# Patient Record
Sex: Female | Born: 1985
Health system: Southern US, Community
[De-identification: ages and names within clinical notes are randomized; demographics above are authoritative.]

## PROBLEM LIST (undated history)

## (undated) DIAGNOSIS — F419 Anxiety disorder, unspecified: Secondary | ICD-10-CM

## (undated) DIAGNOSIS — K219 Gastro-esophageal reflux disease without esophagitis: Secondary | ICD-10-CM

## (undated) HISTORY — DX: Gastro-esophageal reflux disease without esophagitis: K21.9

## (undated) HISTORY — DX: Anxiety disorder, unspecified: F41.9

---

## 2019-02-27 DIAGNOSIS — G43109 Migraine with aura, not intractable, without status migrainosus: Secondary | ICD-10-CM | POA: Diagnosis not present

## 2019-05-16 DIAGNOSIS — M9901 Segmental and somatic dysfunction of cervical region: Secondary | ICD-10-CM | POA: Diagnosis not present

## 2019-05-16 DIAGNOSIS — M7918 Myalgia, other site: Secondary | ICD-10-CM | POA: Diagnosis not present

## 2019-05-16 DIAGNOSIS — G44209 Tension-type headache, unspecified, not intractable: Secondary | ICD-10-CM | POA: Diagnosis not present

## 2019-05-16 DIAGNOSIS — M9902 Segmental and somatic dysfunction of thoracic region: Secondary | ICD-10-CM | POA: Diagnosis not present

## 2019-05-22 DIAGNOSIS — M9901 Segmental and somatic dysfunction of cervical region: Secondary | ICD-10-CM | POA: Diagnosis not present

## 2019-05-22 DIAGNOSIS — M7918 Myalgia, other site: Secondary | ICD-10-CM | POA: Diagnosis not present

## 2019-05-22 DIAGNOSIS — G44209 Tension-type headache, unspecified, not intractable: Secondary | ICD-10-CM | POA: Diagnosis not present

## 2019-05-22 DIAGNOSIS — M9902 Segmental and somatic dysfunction of thoracic region: Secondary | ICD-10-CM | POA: Diagnosis not present

## 2019-05-24 DIAGNOSIS — M7918 Myalgia, other site: Secondary | ICD-10-CM | POA: Diagnosis not present

## 2019-05-24 DIAGNOSIS — M9902 Segmental and somatic dysfunction of thoracic region: Secondary | ICD-10-CM | POA: Diagnosis not present

## 2019-05-24 DIAGNOSIS — M9901 Segmental and somatic dysfunction of cervical region: Secondary | ICD-10-CM | POA: Diagnosis not present

## 2019-05-24 DIAGNOSIS — G44209 Tension-type headache, unspecified, not intractable: Secondary | ICD-10-CM | POA: Diagnosis not present

## 2019-05-29 DIAGNOSIS — M9901 Segmental and somatic dysfunction of cervical region: Secondary | ICD-10-CM | POA: Diagnosis not present

## 2019-05-29 DIAGNOSIS — M7918 Myalgia, other site: Secondary | ICD-10-CM | POA: Diagnosis not present

## 2019-05-29 DIAGNOSIS — G44209 Tension-type headache, unspecified, not intractable: Secondary | ICD-10-CM | POA: Diagnosis not present

## 2019-05-29 DIAGNOSIS — M9902 Segmental and somatic dysfunction of thoracic region: Secondary | ICD-10-CM | POA: Diagnosis not present

## 2019-06-05 DIAGNOSIS — F902 Attention-deficit hyperactivity disorder, combined type: Secondary | ICD-10-CM | POA: Diagnosis not present

## 2019-07-04 DIAGNOSIS — F901 Attention-deficit hyperactivity disorder, predominantly hyperactive type: Secondary | ICD-10-CM | POA: Diagnosis not present

## 2019-07-10 DIAGNOSIS — L814 Other melanin hyperpigmentation: Secondary | ICD-10-CM | POA: Diagnosis not present

## 2019-07-10 DIAGNOSIS — L858 Other specified epidermal thickening: Secondary | ICD-10-CM | POA: Diagnosis not present

## 2019-07-10 DIAGNOSIS — L245 Irritant contact dermatitis due to other chemical products: Secondary | ICD-10-CM | POA: Diagnosis not present

## 2019-07-26 DIAGNOSIS — Z20822 Contact with and (suspected) exposure to covid-19: Secondary | ICD-10-CM | POA: Diagnosis not present

## 2019-10-10 DIAGNOSIS — B348 Other viral infections of unspecified site: Secondary | ICD-10-CM | POA: Diagnosis not present

## 2019-10-10 DIAGNOSIS — D8989 Other specified disorders involving the immune mechanism, not elsewhere classified: Secondary | ICD-10-CM | POA: Diagnosis not present

## 2019-10-10 DIAGNOSIS — R05 Cough: Secondary | ICD-10-CM | POA: Diagnosis not present

## 2019-11-02 DIAGNOSIS — F330 Major depressive disorder, recurrent, mild: Secondary | ICD-10-CM | POA: Diagnosis not present

## 2019-11-05 ENCOUNTER — Other Ambulatory Visit: Payer: Self-pay | Admitting: Obstetrics and Gynecology

## 2019-11-05 DIAGNOSIS — Z0000 Encounter for general adult medical examination without abnormal findings: Secondary | ICD-10-CM | POA: Diagnosis not present

## 2019-11-05 DIAGNOSIS — N644 Mastodynia: Secondary | ICD-10-CM

## 2019-11-05 DIAGNOSIS — R102 Pelvic and perineal pain: Secondary | ICD-10-CM | POA: Diagnosis not present

## 2019-11-19 ENCOUNTER — Ambulatory Visit
Admission: RE | Admit: 2019-11-19 | Discharge: 2019-11-19 | Disposition: A | Discharge status: Home / Self Care | Payer: Self-pay | Source: Ambulatory Visit | Attending: Obstetrics and Gynecology | Admitting: Obstetrics and Gynecology

## 2019-11-19 ENCOUNTER — Ambulatory Visit
Admission: RE | Admit: 2019-11-19 | Discharge: 2019-11-19 | Disposition: A | Discharge status: Home / Self Care | Payer: BC Managed Care – PPO | Source: Ambulatory Visit | Attending: Obstetrics and Gynecology | Admitting: Obstetrics and Gynecology

## 2019-11-19 ENCOUNTER — Other Ambulatory Visit: Payer: Self-pay

## 2019-11-19 DIAGNOSIS — R928 Other abnormal and inconclusive findings on diagnostic imaging of breast: Secondary | ICD-10-CM | POA: Diagnosis not present

## 2019-11-19 DIAGNOSIS — N644 Mastodynia: Secondary | ICD-10-CM | POA: Diagnosis not present

## 2019-11-22 DIAGNOSIS — E063 Autoimmune thyroiditis: Secondary | ICD-10-CM | POA: Diagnosis not present

## 2019-11-22 DIAGNOSIS — R400 Somnolence: Secondary | ICD-10-CM | POA: Diagnosis not present

## 2019-11-22 DIAGNOSIS — R946 Abnormal results of thyroid function studies: Secondary | ICD-10-CM | POA: Diagnosis not present

## 2019-11-22 DIAGNOSIS — Z13220 Encounter for screening for lipoid disorders: Secondary | ICD-10-CM | POA: Diagnosis not present

## 2019-11-22 DIAGNOSIS — M7130 Other bursal cyst, unspecified site: Secondary | ICD-10-CM | POA: Diagnosis not present

## 2019-11-22 DIAGNOSIS — G43109 Migraine with aura, not intractable, without status migrainosus: Secondary | ICD-10-CM | POA: Diagnosis not present

## 2019-11-26 DIAGNOSIS — G479 Sleep disorder, unspecified: Secondary | ICD-10-CM | POA: Diagnosis not present

## 2019-11-26 DIAGNOSIS — N939 Abnormal uterine and vaginal bleeding, unspecified: Secondary | ICD-10-CM | POA: Diagnosis not present

## 2019-11-26 DIAGNOSIS — G43109 Migraine with aura, not intractable, without status migrainosus: Secondary | ICD-10-CM | POA: Diagnosis not present

## 2019-11-26 DIAGNOSIS — R112 Nausea with vomiting, unspecified: Secondary | ICD-10-CM | POA: Diagnosis not present

## 2019-11-26 DIAGNOSIS — R102 Pelvic and perineal pain: Secondary | ICD-10-CM | POA: Diagnosis not present

## 2019-11-26 DIAGNOSIS — M25511 Pain in right shoulder: Secondary | ICD-10-CM | POA: Diagnosis not present

## 2019-11-27 DIAGNOSIS — M25562 Pain in left knee: Secondary | ICD-10-CM | POA: Diagnosis not present

## 2019-11-27 DIAGNOSIS — M25511 Pain in right shoulder: Secondary | ICD-10-CM | POA: Diagnosis not present

## 2019-12-01 DIAGNOSIS — M25562 Pain in left knee: Secondary | ICD-10-CM | POA: Diagnosis not present

## 2019-12-11 DIAGNOSIS — G479 Sleep disorder, unspecified: Secondary | ICD-10-CM | POA: Diagnosis not present

## 2019-12-11 DIAGNOSIS — R112 Nausea with vomiting, unspecified: Secondary | ICD-10-CM | POA: Diagnosis not present

## 2019-12-11 DIAGNOSIS — M25562 Pain in left knee: Secondary | ICD-10-CM | POA: Diagnosis not present

## 2019-12-11 DIAGNOSIS — G44029 Chronic cluster headache, not intractable: Secondary | ICD-10-CM | POA: Diagnosis not present

## 2019-12-18 DIAGNOSIS — M25562 Pain in left knee: Secondary | ICD-10-CM | POA: Diagnosis not present

## 2019-12-18 DIAGNOSIS — M25561 Pain in right knee: Secondary | ICD-10-CM | POA: Diagnosis not present

## 2019-12-27 DIAGNOSIS — E063 Autoimmune thyroiditis: Secondary | ICD-10-CM | POA: Diagnosis not present

## 2019-12-27 DIAGNOSIS — R432 Parageusia: Secondary | ICD-10-CM | POA: Diagnosis not present

## 2019-12-27 DIAGNOSIS — J329 Chronic sinusitis, unspecified: Secondary | ICD-10-CM | POA: Diagnosis not present

## 2019-12-31 DIAGNOSIS — R0681 Apnea, not elsewhere classified: Secondary | ICD-10-CM | POA: Diagnosis not present

## 2019-12-31 DIAGNOSIS — G4719 Other hypersomnia: Secondary | ICD-10-CM | POA: Diagnosis not present

## 2019-12-31 DIAGNOSIS — R0683 Snoring: Secondary | ICD-10-CM | POA: Diagnosis not present

## 2019-12-31 DIAGNOSIS — Z20828 Contact with and (suspected) exposure to other viral communicable diseases: Secondary | ICD-10-CM | POA: Diagnosis not present

## 2019-12-31 DIAGNOSIS — G478 Other sleep disorders: Secondary | ICD-10-CM | POA: Diagnosis not present

## 2020-01-04 DIAGNOSIS — R569 Unspecified convulsions: Secondary | ICD-10-CM | POA: Diagnosis not present

## 2020-01-21 DIAGNOSIS — R0681 Apnea, not elsewhere classified: Secondary | ICD-10-CM | POA: Diagnosis not present

## 2020-01-23 DIAGNOSIS — G4710 Hypersomnia, unspecified: Secondary | ICD-10-CM | POA: Diagnosis not present

## 2020-01-31 ENCOUNTER — Other Ambulatory Visit (HOSPITAL_BASED_OUTPATIENT_CLINIC_OR_DEPARTMENT_OTHER): Payer: Self-pay

## 2020-01-31 DIAGNOSIS — G4710 Hypersomnia, unspecified: Secondary | ICD-10-CM

## 2020-02-12 DIAGNOSIS — M25562 Pain in left knee: Secondary | ICD-10-CM | POA: Diagnosis not present

## 2020-03-02 ENCOUNTER — Other Ambulatory Visit: Payer: Self-pay

## 2020-03-02 ENCOUNTER — Ambulatory Visit (HOSPITAL_BASED_OUTPATIENT_CLINIC_OR_DEPARTMENT_OTHER): Payer: BC Managed Care – PPO | Attending: Internal Medicine | Admitting: Internal Medicine

## 2020-03-02 DIAGNOSIS — G4710 Hypersomnia, unspecified: Secondary | ICD-10-CM | POA: Diagnosis not present

## 2020-03-03 ENCOUNTER — Ambulatory Visit (HOSPITAL_BASED_OUTPATIENT_CLINIC_OR_DEPARTMENT_OTHER): Payer: BC Managed Care – PPO | Attending: Internal Medicine | Admitting: Internal Medicine

## 2020-03-03 DIAGNOSIS — G4710 Hypersomnia, unspecified: Secondary | ICD-10-CM

## 2020-03-09 DIAGNOSIS — G4710 Hypersomnia, unspecified: Secondary | ICD-10-CM | POA: Diagnosis not present

## 2020-03-09 NOTE — Procedures (Signed)
   NAME: Shirley Phillips DATE OF BIRTH:  11/05/1985 MEDICAL RECORD NUMBER [id number]  LOCATION: [facility]  PHYSICIAN: Deretha Emory  DATE OF STUDY: 03/02/2020  SLEEP STUDY TYPE: Nocturnal Polysomnogram               REFERRING PHYSICIAN: Deretha Emory, MD  INDICATION FOR STUDY: Excessive daytime sleepiness in patient with negative HSAT. This test done prior to planned MSLT  EPWORTH SLEEPINESS SCORE:  18 HEIGHT: 5\' 2"  (157.5 cm)  WEIGHT: 123 lb (55.8 kg)    Body mass index is 22.5 kg/m.  NECK SIZE: 13 in.  MEDICATIONS Patient self administered medications include: N/A. Medications administered during study include No sleep medicine administered.  SLEEP STUDY TECHNIQUE A multi-channel overnight Polysomnography study was performed. The channels recorded and monitored were central and occipital EEG, electrooculogram (EOG), submentalis EMG (chin), nasal and oral airflow, thoracic and abdominal wall motion, anterior tibialis EMG, snore microphone, electrocardiogram, and a pulse oximetry.  TECHNICAL COMMENTS Comments added by Technician: NO RESTROOM VISTED. Patient was restless all through the night. Comments added by Scorer: N/A  SLEEP ARCHITECTURE The study was initiated at 11:06:33 PM and terminated at 6:01:02 AM. The total recorded time was 415 minutes. EEG confirmed total sleep time was 354 minutes yielding a sleep efficiency of 85.3%. Sleep onset after lights out was 14.2 minutes with a REM latency of 155.5 minutes. The patient spent 2% of the night in stage N1 sleep, 44.5% in stage N2 sleep, 28% in stage N3 and 26% in REM. Wake after sleep onset (WASO) was 47 minutes. The Arousal Index was 8.2/hour.  RESPIRATORY PARAMETERS There were a total of 0 respiratory disturbances. The apnea/hypopnea index (AHI) was 0 events/hour. The mean oxygen saturation during the study was 96%. The cumulative time under 88% oxygen saturation was 0 minutes.  LEG MOVEMENT  DATA The total leg movements were 0 with a resulting leg movement index of 0/hr .   CARDIAC DATA The underlying cardiac rhythm was most consistent with sinus rhythm. Mean heart rate during sleep was 71 bpm. Additional rhythm abnormalities include None.  IMPRESSIONS - No significant sleep disordered breathing  DIAGNOSIS - Excessive daytime sleepiness  RECOMMENDATIONS - May proceed with MSLT  Marland Kitchen Sleep specialist, American Board of Internal Medicine  ELECTRONICALLY SIGNED ON:  03/09/2020, 1:54 PM [facility] PH: [phone]   FX: [phone] ACCREDITED BY THE AMERICAN ACADEMY OF SLEEP MEDICINE

## 2020-03-10 ENCOUNTER — Other Ambulatory Visit (HOSPITAL_BASED_OUTPATIENT_CLINIC_OR_DEPARTMENT_OTHER): Payer: Self-pay

## 2020-03-10 ENCOUNTER — Other Ambulatory Visit: Payer: Self-pay

## 2020-03-10 DIAGNOSIS — G4710 Hypersomnia, unspecified: Secondary | ICD-10-CM

## 2020-03-10 NOTE — Procedures (Signed)
    NAME: Shirley Phillips DATE OF BIRTH:  [birth year] MEDICAL RECORD NUMBER [id number]  LOCATION: [facility]  PHYSICIAN: Deretha Emory  DATE OF STUDY: 03/03/2020  SLEEP STUDY TYPE: Out of Center Sleep Test                REFERRING PHYSICIAN: Deretha Emory, MD  INDICATION FOR STUDY: see below under clinical information  EPWORTH SLEEPINESS SCORE:  15 HEIGHT:    WEIGHT:      There is no height or weight on file to calculate BMI.  NECK SIZE:   in.  CLINICAL INFORMATION The patient was referred to the sleep center for evaluation of excessive daytime sleepiness. She also has dream enactment, sleep paralysis, and sleep associated hallucinations.  MEDICATIONS Patient self administered medications include: N/A. Medications administered during study: none  SLEEP STUDY TECHNIQUE A multiple sleep latency test was performed. The channels recorded and monitored were central and occipital EEG, electrooculogram (EOG), submentalis EMG (chin), and electrocardiogram.  TECHNICAL COMMENTS Comments added by Technician: None Comments added by Scorer: None  IMPRESSIONS - Average sleep onset latency: 14:06 minutes. This does not suggest pathologic sleepiness. - No sleep onset REMs present. This study does not suggest narcolepsy. - Total number of naps attempted: 5. Total number of naps with sleep attained: 4  DIAGNOSIS - Normal study  RECOMMENDATIONS - Consider evaluation for other causes of excessive daytime sleepiness.   Deretha Emory Sleep specialist, American Board of Internal Medicine  ELECTRONICALLY SIGNED ON:  03/10/2020, 1:52 PM [facility] PH: [phone]   FX: [phone] ACCREDITED BY THE AMERICAN ACADEMY OF SLEEP MEDICINE

## 2020-03-13 DIAGNOSIS — R140 Abdominal distension (gaseous): Secondary | ICD-10-CM | POA: Diagnosis not present

## 2020-03-13 DIAGNOSIS — G4710 Hypersomnia, unspecified: Secondary | ICD-10-CM | POA: Diagnosis not present

## 2020-03-13 DIAGNOSIS — G4750 Parasomnia, unspecified: Secondary | ICD-10-CM | POA: Diagnosis not present

## 2020-03-13 DIAGNOSIS — K5900 Constipation, unspecified: Secondary | ICD-10-CM | POA: Diagnosis not present

## 2020-04-04 ENCOUNTER — Other Ambulatory Visit: Payer: Self-pay

## 2020-04-04 ENCOUNTER — Ambulatory Visit: Payer: BC Managed Care – PPO | Admitting: Internal Medicine

## 2020-04-04 ENCOUNTER — Encounter: Payer: Self-pay | Admitting: Internal Medicine

## 2020-04-04 VITALS — BP 118/72 | HR 51 | Ht 62.0 in | Wt 117.0 lb

## 2020-04-04 DIAGNOSIS — E063 Autoimmune thyroiditis: Secondary | ICD-10-CM | POA: Diagnosis not present

## 2020-04-04 DIAGNOSIS — R140 Abdominal distension (gaseous): Secondary | ICD-10-CM | POA: Diagnosis not present

## 2020-04-04 DIAGNOSIS — E611 Iron deficiency: Secondary | ICD-10-CM | POA: Diagnosis not present

## 2020-04-04 DIAGNOSIS — R5383 Other fatigue: Secondary | ICD-10-CM | POA: Diagnosis not present

## 2020-04-04 MED ORDER — LEVOTHYROXINE SODIUM 50 MCG PO TABS: 50.0000 ug | ORAL_TABLET | Freq: Every day | ORAL | 3 refills | Status: AC

## 2020-04-04 NOTE — Patient Instructions (Signed)
-   Levothyroxine 50 mcg daily    You are on levothyroxine - which is your thyroid hormone supplement. You MUST take this consistently.  You should take this first thing in the morning on an empty stomach with water. You should not take it with other medications. Wait to 1hr prior to eating. If you are taking any vitamins - please take these in the evening.   If you miss a dose, please take your missed dose the following day (double the dose for that day). You should have a pill box for ONLY levothyroxine on your bedside table to help you remember to take your medications.

## 2020-04-04 NOTE — Progress Notes (Signed)
Name: Shirley Phillips  MRN/ DOB: [id number], 06-29-1985    Age/ Sex: 35 y.o., female    PCP: Patient, No Pcp Per   Reason for Endocrinology Evaluation: Hashimoto's Thyroiditis      Date of Initial Endocrinology Evaluation: 04/04/2020     HPI: [name] is a 35 y.o. female with a past medical history of migraine headaches and Hashimoto's Disease. The patient presented for initial endocrinology clinic visit on 04/04/2020 for consultative assistance with her Hashimoto's Thyroiditis .       Pt was diagnosed with Hashimoto's thyroiditis in 12/2012 with an elevated Anti-TPO Ab at 234 IU/mL and elevated TSH up to 10 uIU/mL  , she was initially started on Levothyroxine but developed weigh gain for ~ 2 yrs but then switched to  armour thyroid but was lost to follow up in 2017 due to moving.   She has been having arthralgias and issues with weight   Had infertility issues, ended up with ectopic tubes, S/P bilateral salpingectomy bilaterally per pt.     She is c/o fatigue, insomnia, has constipation and occasional depression , was treated for depression in the past but developed intolerance to antidepressants.   Denies local neck swelling but feels "food and water getting stuck in her neck" . Had previous esophageal dilatation x2   Has occasional palpitations   Sees neurology for migraine headaches    Mother and maternal grandmother with hashimoto's thyroiditis    HISTORY:  Past Medical History:  Past Medical History:  Diagnosis Date  . Anxiety   . GERD (gastroesophageal reflux disease)     Past Surgical History: No past surgical history on file.   Social History:  reports that she has never smoked. She has never used smokeless tobacco. She reports previous alcohol use. She reports that she does not use drugs.  Family History: family history includes Breast cancer in her maternal grandmother.   HOME MEDICATIONS: Allergies as of 04/04/2020   No Known Allergies      Medication List       Accurate as of April 04, 2020  8:09 AM. If you have any questions, ask your nurse or doctor.        levothyroxine 50 MCG tablet Commonly known as: SYNTHROID Take 1 tablet (50 mcg total) by mouth daily. Started by: Scarlette Shorts, MD   meloxicam 15 MG tablet Commonly known as: MOBIC Take 15 mg by mouth daily.         REVIEW OF SYSTEMS: A comprehensive ROS was conducted with the patient and is negative except as per HPI   OBJECTIVE:  VS: BP 118/72   Pulse (!) 51   Ht 5\' 2"  (1.575 m)   Wt 117 lb (53.1 kg)   LMP 03/04/2020   SpO2 98%   BMI 21.40 kg/m    Wt Readings from Last 3 Encounters:  04/04/20 117 lb (53.1 kg)  03/02/20 123 lb (55.8 kg)     EXAM: General: Pt appears well and is in NAD  Neck: General: Supple without adenopathy. Thyroid: Thyroid size normal.  No goiter or nodules appreciated. No thyroid bruit.  Lungs: Clear with good BS bilat with no rales, rhonchi, or wheezes  Heart: Auscultation: RRR.  Abdomen: Normoactive bowel sounds, soft, nontender, without masses or organomegaly palpable  Extremities:  BL LE: No pretibial edema normal ROM and strength.  Skin: Hair: Texture and amount normal with gender appropriate distribution Skin Inspection: No rashes Skin Palpation: Skin temperature, texture, and thickness  normal to palpation  Neuro: Cranial nerves: II - XII grossly intact  Motor: Normal strength throughout DTRs: 2+ and symmetric in UE without delay in relaxation phase  Mental Status: Judgment, insight: Intact Orientation: Oriented to time, place, and person Mood and affect: No depression, anxiety, or agitation     DATA REVIEWED:    11/22/2019 TSH 6.74 uIU/mL  FT4 0.88 ng/dL    Anti-TPO 732 IU/mL   ASSESSMENT/PLAN/RECOMMENDATIONS:   1. Hashimoto's Thyroiditis :  - Pt with multiple symptoms  - Has dysphagia but thyroid exam is normal and suspect this may had to do with esophageal pathology , pt will   Contact PCP for this  - - Pt educated extensively on the correct way to take levothyroxine (first thing in the morning with water, 30 minutes before eating or taking other medications). - Pt encouraged to double dose the following day if she were to miss a dose given long half-life of levothyroxine. -  I have advised the patient for my preference to go with Levothyroxine rather then armour thyroid, due to more stability with T4 content in levothyroxine and also armour thyroid has non-physiologic levels of T4:T3 of  4:1 (physiologic levels 13:1 to 16:1) - Pt opted to proceed with Levothyroxine at this time  - She was also given the option of starting with half a tablet for 2 weeks then increasing to full tablet due to her concerns of palpitations    Medications : Start Levothyroxine 50 mcg daily    Labs in 6 weeks  F/U in 3 months    Signed electronically by: Lyndle Herrlich, MD  [facility]  [facility] [address] [place], [place] [address] Phone: [phone] FAX: [phone]   CC: Patient, No Pcp Per No address on file Phone: None Fax: None   Return to Endocrinology clinic as below: No future appointments.

## 2020-04-09 DIAGNOSIS — J45909 Unspecified asthma, uncomplicated: Secondary | ICD-10-CM | POA: Diagnosis not present

## 2020-04-09 DIAGNOSIS — E063 Autoimmune thyroiditis: Secondary | ICD-10-CM | POA: Diagnosis not present

## 2020-04-09 DIAGNOSIS — Z8759 Personal history of other complications of pregnancy, childbirth and the puerperium: Secondary | ICD-10-CM | POA: Diagnosis not present

## 2020-04-09 DIAGNOSIS — Z79899 Other long term (current) drug therapy: Secondary | ICD-10-CM | POA: Diagnosis not present

## 2020-04-09 DIAGNOSIS — Z136 Encounter for screening for cardiovascular disorders: Secondary | ICD-10-CM | POA: Diagnosis not present

## 2020-04-09 DIAGNOSIS — G43909 Migraine, unspecified, not intractable, without status migrainosus: Secondary | ICD-10-CM | POA: Diagnosis not present

## 2020-04-09 DIAGNOSIS — Z8719 Personal history of other diseases of the digestive system: Secondary | ICD-10-CM | POA: Diagnosis not present

## 2020-04-09 DIAGNOSIS — Z8349 Family history of other endocrine, nutritional and metabolic diseases: Secondary | ICD-10-CM | POA: Diagnosis not present

## 2020-04-09 DIAGNOSIS — R002 Palpitations: Secondary | ICD-10-CM | POA: Diagnosis not present

## 2020-04-09 DIAGNOSIS — R079 Chest pain, unspecified: Secondary | ICD-10-CM | POA: Diagnosis not present

## 2020-04-09 DIAGNOSIS — R030 Elevated blood-pressure reading, without diagnosis of hypertension: Secondary | ICD-10-CM | POA: Diagnosis not present

## 2020-04-09 DIAGNOSIS — R42 Dizziness and giddiness: Secondary | ICD-10-CM | POA: Diagnosis not present

## 2020-04-17 DIAGNOSIS — J0181 Other acute recurrent sinusitis: Secondary | ICD-10-CM | POA: Diagnosis not present

## 2020-04-17 DIAGNOSIS — B348 Other viral infections of unspecified site: Secondary | ICD-10-CM | POA: Diagnosis not present

## 2020-04-21 DIAGNOSIS — R0602 Shortness of breath: Secondary | ICD-10-CM | POA: Diagnosis not present

## 2020-04-21 DIAGNOSIS — R002 Palpitations: Secondary | ICD-10-CM | POA: Diagnosis not present

## 2020-04-21 DIAGNOSIS — R42 Dizziness and giddiness: Secondary | ICD-10-CM | POA: Diagnosis not present

## 2020-04-21 DIAGNOSIS — I208 Other forms of angina pectoris: Secondary | ICD-10-CM | POA: Diagnosis not present

## 2020-05-15 ENCOUNTER — Other Ambulatory Visit: Payer: BC Managed Care – PPO

## 2020-05-29 DIAGNOSIS — I208 Other forms of angina pectoris: Secondary | ICD-10-CM | POA: Diagnosis not present

## 2020-05-29 DIAGNOSIS — R0602 Shortness of breath: Secondary | ICD-10-CM | POA: Diagnosis not present

## 2020-05-29 DIAGNOSIS — R002 Palpitations: Secondary | ICD-10-CM | POA: Diagnosis not present

## 2020-06-25 DIAGNOSIS — F411 Generalized anxiety disorder: Secondary | ICD-10-CM | POA: Diagnosis not present

## 2020-06-25 DIAGNOSIS — F331 Major depressive disorder, recurrent, moderate: Secondary | ICD-10-CM | POA: Diagnosis not present

## 2020-06-25 DIAGNOSIS — F606 Avoidant personality disorder: Secondary | ICD-10-CM | POA: Diagnosis not present

## 2020-07-03 DIAGNOSIS — B80 Enterobiasis: Secondary | ICD-10-CM | POA: Diagnosis not present

## 2020-07-10 DIAGNOSIS — F411 Generalized anxiety disorder: Secondary | ICD-10-CM | POA: Diagnosis not present

## 2020-07-10 DIAGNOSIS — F331 Major depressive disorder, recurrent, moderate: Secondary | ICD-10-CM | POA: Diagnosis not present

## 2020-07-10 DIAGNOSIS — F606 Avoidant personality disorder: Secondary | ICD-10-CM | POA: Diagnosis not present

## 2020-07-11 ENCOUNTER — Ambulatory Visit: Payer: BC Managed Care – PPO | Admitting: Internal Medicine

## 2020-07-15 DIAGNOSIS — M62830 Muscle spasm of back: Secondary | ICD-10-CM | POA: Diagnosis not present

## 2020-08-04 DIAGNOSIS — M9902 Segmental and somatic dysfunction of thoracic region: Secondary | ICD-10-CM | POA: Diagnosis not present

## 2020-08-04 DIAGNOSIS — M542 Cervicalgia: Secondary | ICD-10-CM | POA: Diagnosis not present

## 2020-08-04 DIAGNOSIS — M9901 Segmental and somatic dysfunction of cervical region: Secondary | ICD-10-CM | POA: Diagnosis not present

## 2020-08-04 DIAGNOSIS — M9903 Segmental and somatic dysfunction of lumbar region: Secondary | ICD-10-CM | POA: Diagnosis not present

## 2020-08-05 DIAGNOSIS — M542 Cervicalgia: Secondary | ICD-10-CM | POA: Diagnosis not present

## 2020-08-05 DIAGNOSIS — M9902 Segmental and somatic dysfunction of thoracic region: Secondary | ICD-10-CM | POA: Diagnosis not present

## 2020-08-05 DIAGNOSIS — M9901 Segmental and somatic dysfunction of cervical region: Secondary | ICD-10-CM | POA: Diagnosis not present

## 2020-08-05 DIAGNOSIS — M9903 Segmental and somatic dysfunction of lumbar region: Secondary | ICD-10-CM | POA: Diagnosis not present

## 2020-08-06 DIAGNOSIS — M9903 Segmental and somatic dysfunction of lumbar region: Secondary | ICD-10-CM | POA: Diagnosis not present

## 2020-08-06 DIAGNOSIS — M542 Cervicalgia: Secondary | ICD-10-CM | POA: Diagnosis not present

## 2020-08-06 DIAGNOSIS — M9901 Segmental and somatic dysfunction of cervical region: Secondary | ICD-10-CM | POA: Diagnosis not present

## 2020-08-06 DIAGNOSIS — M9902 Segmental and somatic dysfunction of thoracic region: Secondary | ICD-10-CM | POA: Diagnosis not present

## 2020-08-14 DIAGNOSIS — G43009 Migraine without aura, not intractable, without status migrainosus: Secondary | ICD-10-CM | POA: Diagnosis not present

## 2020-08-14 DIAGNOSIS — M7918 Myalgia, other site: Secondary | ICD-10-CM | POA: Diagnosis not present

## 2020-08-14 DIAGNOSIS — R439 Unspecified disturbances of smell and taste: Secondary | ICD-10-CM | POA: Diagnosis not present

## 2020-08-19 DIAGNOSIS — M9903 Segmental and somatic dysfunction of lumbar region: Secondary | ICD-10-CM | POA: Diagnosis not present

## 2020-08-19 DIAGNOSIS — M9901 Segmental and somatic dysfunction of cervical region: Secondary | ICD-10-CM | POA: Diagnosis not present

## 2020-08-19 DIAGNOSIS — M542 Cervicalgia: Secondary | ICD-10-CM | POA: Diagnosis not present

## 2020-08-19 DIAGNOSIS — M9902 Segmental and somatic dysfunction of thoracic region: Secondary | ICD-10-CM | POA: Diagnosis not present

## 2020-09-05 DIAGNOSIS — R0789 Other chest pain: Secondary | ICD-10-CM | POA: Diagnosis not present

## 2020-09-05 DIAGNOSIS — R002 Palpitations: Secondary | ICD-10-CM | POA: Diagnosis not present

## 2020-09-05 DIAGNOSIS — R0602 Shortness of breath: Secondary | ICD-10-CM | POA: Diagnosis not present

## 2020-09-10 DIAGNOSIS — J0181 Other acute recurrent sinusitis: Secondary | ICD-10-CM | POA: Diagnosis not present

## 2020-09-18 DIAGNOSIS — M542 Cervicalgia: Secondary | ICD-10-CM | POA: Diagnosis not present

## 2020-09-19 DIAGNOSIS — M542 Cervicalgia: Secondary | ICD-10-CM | POA: Diagnosis not present

## 2020-09-22 ENCOUNTER — Other Ambulatory Visit (HOSPITAL_COMMUNITY): Payer: Self-pay | Admitting: Physical Medicine & Rehabilitation

## 2020-09-22 ENCOUNTER — Other Ambulatory Visit: Payer: Self-pay | Admitting: Physical Medicine & Rehabilitation

## 2020-09-22 DIAGNOSIS — J3489 Other specified disorders of nose and nasal sinuses: Secondary | ICD-10-CM | POA: Diagnosis not present

## 2020-09-22 DIAGNOSIS — I7774 Dissection of vertebral artery: Secondary | ICD-10-CM

## 2020-09-22 DIAGNOSIS — M542 Cervicalgia: Secondary | ICD-10-CM | POA: Diagnosis not present

## 2020-09-24 ENCOUNTER — Ambulatory Visit (HOSPITAL_COMMUNITY)
Admission: RE | Admit: 2020-09-24 | Discharge: 2020-09-24 | Disposition: A | Discharge status: Home / Self Care | Payer: BC Managed Care – PPO | Source: Ambulatory Visit | Attending: Physical Medicine & Rehabilitation | Admitting: Physical Medicine & Rehabilitation

## 2020-09-24 ENCOUNTER — Other Ambulatory Visit: Payer: Self-pay

## 2020-09-24 DIAGNOSIS — I7774 Dissection of vertebral artery: Secondary | ICD-10-CM | POA: Insufficient documentation

## 2020-09-24 DIAGNOSIS — R519 Headache, unspecified: Secondary | ICD-10-CM | POA: Diagnosis not present

## 2020-09-24 MED ORDER — GADOBUTROL 1 MMOL/ML IV SOLN
5.0000 mL | Freq: Once | INTRAVENOUS | Status: AC | PRN
  Administered 2020-09-24: 5 mL via INTRAVENOUS

## 2020-10-24 DIAGNOSIS — G629 Polyneuropathy, unspecified: Secondary | ICD-10-CM | POA: Diagnosis not present

## 2020-10-24 DIAGNOSIS — G894 Chronic pain syndrome: Secondary | ICD-10-CM | POA: Diagnosis not present

## 2020-10-24 DIAGNOSIS — Z79891 Long term (current) use of opiate analgesic: Secondary | ICD-10-CM | POA: Diagnosis not present

## 2020-10-24 DIAGNOSIS — Z79899 Other long term (current) drug therapy: Secondary | ICD-10-CM | POA: Diagnosis not present

## 2020-10-24 DIAGNOSIS — G90519 Complex regional pain syndrome I of unspecified upper limb: Secondary | ICD-10-CM | POA: Diagnosis not present

## 2020-11-07 DIAGNOSIS — J342 Deviated nasal septum: Secondary | ICD-10-CM | POA: Diagnosis not present

## 2020-11-07 DIAGNOSIS — R0982 Postnasal drip: Secondary | ICD-10-CM | POA: Diagnosis not present

## 2020-11-07 DIAGNOSIS — R1314 Dysphagia, pharyngoesophageal phase: Secondary | ICD-10-CM | POA: Diagnosis not present

## 2020-11-07 DIAGNOSIS — K219 Gastro-esophageal reflux disease without esophagitis: Secondary | ICD-10-CM | POA: Diagnosis not present

## 2020-11-14 DIAGNOSIS — J342 Deviated nasal septum: Secondary | ICD-10-CM | POA: Diagnosis not present

## 2020-11-21 DIAGNOSIS — J342 Deviated nasal septum: Secondary | ICD-10-CM | POA: Diagnosis not present

## 2020-11-21 DIAGNOSIS — R519 Headache, unspecified: Secondary | ICD-10-CM | POA: Diagnosis not present

## 2020-12-16 ENCOUNTER — Encounter: Payer: Self-pay | Admitting: Otolaryngology

## 2020-12-16 ENCOUNTER — Other Ambulatory Visit: Payer: Self-pay

## 2020-12-19 DIAGNOSIS — J342 Deviated nasal septum: Secondary | ICD-10-CM | POA: Diagnosis not present

## 2020-12-19 DIAGNOSIS — R519 Headache, unspecified: Secondary | ICD-10-CM | POA: Diagnosis not present

## 2020-12-19 NOTE — Discharge Instructions (Signed)
[facility] ENDOSCOPIC SINUS SURGERY [facility]  What is Functional Endoscopic Sinus Surgery?  The Surgery involves making the natural openings of the sinuses larger by removing the bony partitions that separate the sinuses from the nasal cavity.  The natural sinus lining is preserved as much as possible to allow the sinuses to resume normal function after the surgery.  In some patients nasal polyps (excessively swollen lining of the sinuses) may be removed to relieve obstruction of the sinus openings.  The surgery is performed through the nose using lighted scopes, which eliminates the need for incisions on the face.  A septoplasty is a different procedure which is sometimes performed with sinus surgery.  It involves straightening the boy partition that separates the two sides of your nose.  A crooked or deviated septum may need repair if is obstructing the sinuses or nasal airflow.  Turbinate reduction is also often performed during sinus surgery.  The turbinates are bony proturberances from the side walls of the nose which swell and can obstruct the nose in patients with sinus and allergy problems.  Their size can be surgically reduced to help relieve nasal obstruction.  What Can Sinus Surgery Do For Me?  Sinus surgery can reduce the frequency of sinus infections requiring antibiotic treatment.  This can provide improvement in nasal congestion, post-nasal drainage, facial pressure and nasal obstruction.  Surgery will NOT prevent you from ever having an infection again, so it usually only for patients who get infections 4 or more times yearly requiring antibiotics, or for infections that do not clear with antibiotics.  It will not cure nasal allergies, so patients with allergies may still require medication to treat their allergies after surgery. Surgery may improve headaches related to sinusitis, however, some people will continue to  require medication to control sinus headaches related to allergies.  Surgery will do nothing for other forms of headache (migraine, tension or cluster).  What Are the Risks of Endoscopic Sinus Surgery?  Current techniques allow surgery to be performed safely with little risk, however, there are rare complications that patients should be aware of.  Because the sinuses are located around the eyes, there is risk of eye injury, including blindness, though again, this would be quite rare. This is usually a result of bleeding behind the eye during surgery, which can effect vision, though there are treatments to protect the vision and prevent permanent injury. More serious complications would include bleeding inside the brain cavity or damage to the brain.This happens when the fluid around the brain leaks out into the sinus cavity.  Again, all of these complications are uncommon, and spinal fluid leaks can be safely managed surgically if they occur.  The most common complication of sinus surgery is bleeding from the nose, which may require packing or cauterization of the nose.  Patients with polyps may experience recurrence of the polyps that would require revision surgery.  Alterations of sense of smell or injury to the tear ducts are also rare complications.   What is the Surgery Like, and what is the Recovery?  The Surgery usually takes a couple of hours to perform, and is usually performed under a general anesthetic (completely asleep).  Patients are usually discharged home after a couple of hours.  Sometimes during surgery it is necessary to pack the nose to control bleeding, and the packing is left in place for 24 - 48 hours, and removed by your surgeon.  If   a septoplasty was performed during the procedure, there is often a splint placed which must be removed after 5-7 days.   Discomfort: Pain is usually mild to moderate, and can be controlled by prescription pain medication or acetaminophen (Tylenol).   Aspirin, Ibuprofen (Advil, Motrin), or Naprosyn (Aleve) should be avoided, as they can cause increased bleeding.  Most patients feel sinus pressure like they have a bad head cold for several days.  Sleeping with your head elevated can help reduce swelling and facial pressure, as can ice packs over the face.  A humidifier may be helpful to keep the mucous and blood from drying in the nose.   Diet: There are no specific diet restrictions, however, you should generally start with clear liquids and a light diet of bland foods because the anesthetic can cause some nausea.  Advance your diet depending on how your stomach feels.  Taking your pain medication with food will often help reduce stomach upset which pain medications can cause.  Nasal Saline Irrigation: It is important to remove blood clots and dried mucous from the nose as it is healing.  This is done by having you irrigate the nose at least 3 - 4 times daily with a salt water solution.  We recommend using NeilMed Sinus Rinse (available at the drug store).  Fill the squeeze bottle with the solution, bend over a sink, and insert the tip of the squeeze bottle into the nose  of an inch.  Point the tip of the squeeze bottle towards the inside corner of the eye on the same side your irrigating.  Squeeze the bottle and gently irrigate the nose.  If you bend forward as you do this, most of the fluid will flow back out of the nose, instead of down your throat.   The solution should be warm, near body temperature, when you irrigate.   Each time you irrigate, you should use a full squeeze bottle.   Note that if you are instructed to use Nasal Steroid Sprays at any time after your surgery, irrigate with saline BEFORE using the steroid spray, so you do not wash it all out of the nose. Another product, Nasal Saline Gel (such as AYR Nasal Saline Gel) can be applied in each nostril 3 - 4 times daily to moisture the nose and reduce scabbing or crusting.  Bleeding:   Bloody drainage from the nose can be expected for several days, and patients are instructed to irrigate their nose frequently with salt water to help remove mucous and blood clots.  The drainage may be dark red or brown, though some fresh blood may be seen intermittently, especially after irrigation.  Do not blow you nose, as bleeding may occur. If you must sneeze, keep your mouth open to allow air to escape through your mouth.  If heavy bleeding occurs: Irrigate the nose with saline to rinse out clots, then spray the nose 3 - 4 times with Afrin Nasal Decongestant Spray.  The spray will constrict the blood vessels to slow bleeding.  Pinch the lower half of your nose shut to apply pressure, and lay down with your head elevated.  Ice packs over the nose may help as well. If bleeding persists despite these measures, you should notify your doctor.  Do not use the Afrin routinely to control nasal congestion after surgery, as it can result in worsening congestion and may affect healing.     Activity: Return to work varies among patients. Most patients will be out   of work at least 5 - 7 days to recover.  Patient may return to work after they are off of narcotic pain medication, and feeling well enough to perform the functions of their job.  Patients must avoid heavy lifting (over 10 pounds) or strenuous physical for 2 weeks after surgery, so your employer may need to assign you to light duty, or keep you out of work longer if light duty is not possible.  NOTE: you should not drive, operate dangerous machinery, do any mentally demanding tasks or make any important legal or financial decisions while on narcotic pain medication and recovering from the general anesthetic.    Call Your Doctor Immediately if You Have Any of the Following: Bleeding that you cannot control with the above measures Loss of vision, double vision, bulging of the eye or black eyes. Fever over 101 degrees Neck stiffness with severe headache,  fever, nausea and change in mental state. You are always encouraged to call anytime with concerns, however, please call with requests for pain medication refills during office hours.  Office Endoscopy: During follow-up visits your doctor will remove any packing or splints that may have been placed and evaluate and clean your sinuses endoscopically.  Topical anesthetic will be used to make this as comfortable as possible, though you may want to take your pain medication prior to the visit.  How often this will need to be done varies from patient to patient.  After complete recovery from the surgery, you may need follow-up endoscopy from time to time, particularly if there is concern of recurrent infection or nasal polyps.  

## 2020-12-25 ENCOUNTER — Ambulatory Visit
Admission: RE | Admit: 2020-12-25 | Discharge: 2020-12-25 | Disposition: A | Discharge status: Home / Self Care | Payer: BC Managed Care – PPO | Attending: Otolaryngology | Admitting: Otolaryngology

## 2020-12-25 ENCOUNTER — Other Ambulatory Visit: Payer: Self-pay

## 2020-12-25 ENCOUNTER — Ambulatory Visit: Payer: BC Managed Care – PPO | Admitting: Anesthesiology

## 2020-12-25 ENCOUNTER — Encounter: Payer: Self-pay | Admitting: Otolaryngology

## 2020-12-25 ENCOUNTER — Encounter
Admission: RE | Disposition: A | Discharge status: Home / Self Care | Payer: Self-pay | Source: Home / Self Care | Attending: Otolaryngology

## 2020-12-25 DIAGNOSIS — Z79899 Other long term (current) drug therapy: Secondary | ICD-10-CM | POA: Diagnosis not present

## 2020-12-25 DIAGNOSIS — J342 Deviated nasal septum: Secondary | ICD-10-CM | POA: Diagnosis not present

## 2020-12-25 DIAGNOSIS — J343 Hypertrophy of nasal turbinates: Secondary | ICD-10-CM | POA: Diagnosis not present

## 2020-12-25 DIAGNOSIS — Z888 Allergy status to other drugs, medicaments and biological substances status: Secondary | ICD-10-CM | POA: Diagnosis not present

## 2020-12-25 HISTORY — PX: NASAL SEPTOPLASTY W/ TURBINOPLASTY: SHX2070

## 2020-12-25 SURGERY — SEPTOPLASTY, NOSE, WITH NASAL TURBINATE REDUCTION
Anesthesia: General | Site: Nose | Laterality: Bilateral

## 2020-12-25 MED ORDER — SCOPOLAMINE 1 MG/3DAYS TD PT72
1.0000 | MEDICATED_PATCH | Freq: Once | TRANSDERMAL | Status: DC
  Administered 2020-12-25: 1.5 mg via TRANSDERMAL

## 2020-12-25 MED ORDER — CEFAZOLIN SODIUM-DEXTROSE 2-4 GM/100ML-% IV SOLN
2.0000 g | INTRAVENOUS | Status: AC
  Administered 2020-12-25: 2 g via INTRAVENOUS

## 2020-12-25 MED ORDER — SUCCINYLCHOLINE CHLORIDE 200 MG/10ML IV SOSY
PREFILLED_SYRINGE | INTRAVENOUS | Status: DC | PRN
  Administered 2020-12-25: 80 mg via INTRAVENOUS

## 2020-12-25 MED ORDER — OXYMETAZOLINE HCL 0.05 % NA SOLN
2.0000 | NASAL | Status: AC
  Administered 2020-12-25: 2 via NASAL

## 2020-12-25 MED ORDER — LIDOCAINE HCL (CARDIAC) PF 100 MG/5ML IV SOSY
PREFILLED_SYRINGE | INTRAVENOUS | Status: DC | PRN
  Administered 2020-12-25: 50 mg via INTRAVENOUS

## 2020-12-25 MED ORDER — FENTANYL CITRATE (PF) 100 MCG/2ML IJ SOLN
INTRAMUSCULAR | Status: DC | PRN
  Administered 2020-12-25 (×2): 50 ug via INTRAVENOUS

## 2020-12-25 MED ORDER — MIDAZOLAM HCL 5 MG/5ML IJ SOLN
INTRAMUSCULAR | Status: DC | PRN
  Administered 2020-12-25: 2 mg via INTRAVENOUS

## 2020-12-25 MED ORDER — LIDOCAINE-EPINEPHRINE 1 %-1:100000 IJ SOLN
INTRAMUSCULAR | Status: DC | PRN
  Administered 2020-12-25: 6 mL

## 2020-12-25 MED ORDER — LACTATED RINGERS IV SOLN: INTRAVENOUS | Status: DC

## 2020-12-25 MED ORDER — ACETAMINOPHEN 10 MG/ML IV SOLN
1000.0000 mg | Freq: Once | INTRAVENOUS | Status: AC
  Administered 2020-12-25: 1000 mg via INTRAVENOUS

## 2020-12-25 MED ORDER — GLYCOPYRROLATE 0.2 MG/ML IJ SOLN
INTRAMUSCULAR | Status: DC | PRN
  Administered 2020-12-25: .1 mg via INTRAVENOUS

## 2020-12-25 MED ORDER — DEXAMETHASONE SODIUM PHOSPHATE 4 MG/ML IJ SOLN
INTRAMUSCULAR | Status: DC | PRN
  Administered 2020-12-25: 10 mg via INTRAVENOUS

## 2020-12-25 MED ORDER — OXYCODONE HCL 5 MG PO TABS: 5.0000 mg | ORAL_TABLET | Freq: Once | ORAL | Status: AC | PRN

## 2020-12-25 MED ORDER — PHENYLEPHRINE HCL 0.5 % NA SOLN
NASAL | Status: DC | PRN
  Administered 2020-12-25: 30 mL via NASAL

## 2020-12-25 MED ORDER — TRAMADOL HCL 50 MG PO TABS: ORAL_TABLET | ORAL | 0 refills | Status: AC

## 2020-12-25 MED ORDER — OXYCODONE HCL 5 MG/5ML PO SOLN
5.0000 mg | Freq: Once | ORAL | Status: AC | PRN
Start: 2020-12-25 — End: 2020-12-25
  Administered 2020-12-25: 5 mg via ORAL

## 2020-12-25 MED ORDER — FENTANYL CITRATE PF 50 MCG/ML IJ SOSY
25.0000 ug | PREFILLED_SYRINGE | INTRAMUSCULAR | Status: DC | PRN
  Administered 2020-12-25: 25 ug via INTRAVENOUS
  Administered 2020-12-25: 50 ug via INTRAVENOUS
  Administered 2020-12-25: 25 ug via INTRAVENOUS

## 2020-12-25 MED ORDER — ONDANSETRON HCL 4 MG/2ML IJ SOLN
4.0000 mg | Freq: Once | INTRAMUSCULAR | Status: AC | PRN
  Administered 2020-12-25: 4 mg via INTRAVENOUS

## 2020-12-25 MED ORDER — PROPOFOL 10 MG/ML IV BOLUS
INTRAVENOUS | Status: DC | PRN
  Administered 2020-12-25: 140 mg via INTRAVENOUS

## 2020-12-25 MED ORDER — PREDNISONE 10 MG PO TABS
ORAL_TABLET | ORAL | 0 refills | Status: AC
Start: 2020-12-25 — End: ?

## 2020-12-25 MED ORDER — CEPHALEXIN 500 MG PO CAPS: 500.0000 mg | ORAL_CAPSULE | Freq: Two times a day (BID) | ORAL | 0 refills | Status: AC

## 2020-12-25 SURGICAL SUPPLY — 25 items
CANISTER SUCT 1200ML W/VALVE (MISCELLANEOUS) ×2 IMPLANT
COAGULATOR SUCT 8FR VV (MISCELLANEOUS) ×2 IMPLANT
ELECT REM PT RETURN 9FT ADLT (ELECTROSURGICAL) ×2
ELECTRODE REM PT RTRN 9FT ADLT (ELECTROSURGICAL) ×1 IMPLANT
GLOVE SURG GAMMEX PI TX LF 7.5 (GLOVE) ×4 IMPLANT
GOWN STRL REUS W/ TWL LRG LVL3 (GOWN DISPOSABLE) ×1 IMPLANT
GOWN STRL REUS W/TWL LRG LVL3 (GOWN DISPOSABLE) ×2
KIT TURNOVER KIT A (KITS) ×2 IMPLANT
NEEDLE ANESTHESIA  27G X 3.5 (NEEDLE) ×1
NEEDLE ANESTHESIA 27G X 3.5 (NEEDLE) ×1 IMPLANT
NEEDLE HYPO 27GX1-1/4 (NEEDLE) ×2 IMPLANT
PACK ENT CUSTOM (PACKS) ×2 IMPLANT
PATTIES SURGICAL .5 X3 (DISPOSABLE) ×2 IMPLANT
SOL ANTI-FOG 6CC FOG-OUT (MISCELLANEOUS) ×1 IMPLANT
SOL FOG-OUT ANTI-FOG 6CC (MISCELLANEOUS) ×1
SPLINT NASAL SEPTAL BLV .50 ST (MISCELLANEOUS) ×2 IMPLANT
STRAP BODY AND KNEE 60X3 (MISCELLANEOUS) ×2 IMPLANT
SUT CHROMIC 3-0 (SUTURE) ×2
SUT CHROMIC 3-0 KS 27XMFL CR (SUTURE) ×1
SUT ETHILON 3-0 KS 30 BLK (SUTURE) ×2 IMPLANT
SUT PLAIN GUT 4-0 (SUTURE) ×2 IMPLANT
SUTURE CHRMC 3-0 KS 27XMFL CR (SUTURE) ×1 IMPLANT
SYR 3ML LL SCALE MARK (SYRINGE) ×2 IMPLANT
TOWEL OR 17X26 4PK STRL BLUE (TOWEL DISPOSABLE) ×2 IMPLANT
WATER STERILE IRR 250ML POUR (IV SOLUTION) ×2 IMPLANT

## 2020-12-25 NOTE — Anesthesia Procedure Notes (Signed)
Procedure Name: Intubation Date/Time: 12/25/2020 8:45 AM Performed by: Jimmy Picket, CRNA Pre-anesthesia Checklist: Patient identified, Emergency Drugs available, Suction available, Patient being monitored and Timeout performed Patient Re-evaluated:Patient Re-evaluated prior to induction Oxygen Delivery Method: Circle system utilized Preoxygenation: Pre-oxygenation with 100% oxygen Induction Type: IV induction Ventilation: Mask ventilation without difficulty Laryngoscope Size: Miller and 2 Grade View: Grade I Tube type: Oral Rae Tube size: 7.0 mm Number of attempts: 1 Placement Confirmation: ETT inserted through vocal cords under direct vision, positive ETCO2 and breath sounds checked- equal and bilateral Tube secured with: Tape Dental Injury: Teeth and Oropharynx as per pre-operative assessment

## 2020-12-25 NOTE — Transfer of Care (Signed)
Immediate Anesthesia Transfer of Care Note  Patient: Shirley Phillips  Procedure(s) Performed: NASAL SEPTOPLASTY WITH INFERIOR TURBINATE REDUCTION (Bilateral: Nose)  Patient Location: PACU  Anesthesia Type: General ETT  Level of Consciousness: awake, alert  and patient cooperative  Airway and Oxygen Therapy: Patient Spontanous Breathing and Patient connected to supplemental oxygen  Post-op Assessment: Post-op Vital signs reviewed, Patient's Cardiovascular Status Stable, Respiratory Function Stable, Patent Airway and No signs of Nausea or vomiting  Post-op Vital Signs: Reviewed and stable  Complications: No notable events documented.

## 2020-12-25 NOTE — Anesthesia Preprocedure Evaluation (Signed)
Anesthesia Evaluation  Patient identified by MRN, date of birth, ID band Patient awake    Reviewed: Allergy & Precautions, H&P , NPO status , Patient's Chart, lab work & pertinent test results  Airway Mallampati: II  TM Distance: >3 FB Neck ROM: full    Dental no notable dental hx.    Pulmonary    Pulmonary exam normal breath sounds clear to auscultation       Cardiovascular Normal cardiovascular exam Rhythm:regular Rate:Normal     Neuro/Psych Anxiety    GI/Hepatic GERD  ,  Endo/Other  Hashimoto  Renal/GU      Musculoskeletal   Abdominal   Peds  Hematology   Anesthesia Other Findings   Reproductive/Obstetrics                             Anesthesia Physical Anesthesia Plan  ASA: 2  Anesthesia Plan: General ETT   Post-op Pain Management:    Induction:   PONV Risk Score and Plan: 3 and Treatment may vary due to age or medical condition, Ondansetron, Dexamethasone and Scopolamine patch - Pre-op  Airway Management Planned:   Additional Equipment:   Intra-op Plan:   Post-operative Plan:   Informed Consent: I have reviewed the patients History and Physical, chart, labs and discussed the procedure including the risks, benefits and alternatives for the proposed anesthesia with the patient or authorized representative who has indicated his/her understanding and acceptance.     Dental Advisory Given  Plan Discussed with: CRNA  Anesthesia Plan Comments:         Anesthesia Quick Evaluation

## 2020-12-25 NOTE — H&P (Signed)
H&P has been reviewed and patient reevaluated, no changes necessary. To be downloaded later.  

## 2020-12-25 NOTE — Op Note (Signed)
12/25/2020  9:42 AM  [id number]   Pre-Op Dx:  Deviated Nasal Septum, Hypertrophic Inferior Turbinates  Post-op Dx: Same  Proc: Nasal Septoplasty, Bilateral Partial Reduction Inferior Turbinates   Surg:  Beverly Sessions Mykah Shin  Anes:  GOT  EBL: 30 mL  Comp: None  Findings: Her septum was deviated to the right side with a very large vomer spur that was pinching into the inferior turbinate and distorting it.  This was causing chronic pain in her right side from this large spur  Procedure: With the patient in a comfortable supine position,  general orotracheal anesthesia was induced without difficulty.     The patient received preoperative Afrin spray for topical decongestion and vasoconstriction.  Intravenous prophylactic antibiotics were administered.  At an appropriate level, the patient was placed in a semi-sitting position.  Nasal vibrissae were trimmed.   1% Xylocaine with 1:100,000 epinephrine, 5-1/2 cc's, was infiltrated into the anterior floor of the nose, into the nasal spine region, into the membranous columella, and finally into the submucoperichondrial plane of the septum on both sides.  Several minutes were allowed for this to take effect.  Cottoniod pledgetts soaked in Afrin and 4% Xylocaine were placed into both nasal cavities and left while the patient was prepped and draped in the standard fashion.  The materials were removed from the nose and observed to be intact and correct in number.  The nose was inspected with a headlight and zero degree scope with the findings as described above.  A left Killian incision was sharply executed and carried down to the quadrangular cartilage. The mucoperichondrium was elelvated along the quadrangular plate back to the bony-cartilaginous junction. The mucoperiostium was then elevated along the ethmoid plate and the vomer. The boney-catilaginous junction was then split with a freer elevator and the mucoperiosteum was elevated on the opposite side.  The mucoperiosteum was then elevated along the maxillary crest as needed to expose the crooked bone of the crest.  Boney spurs of the vomer and maxillary crest were removed with Lenoria Chime forceps.  This removed a large spur that was pushing into her right inferior turbinate.  Clinically  The cartilaginous plate was trimmed along its posterior and inferior borders of about 2 mm of cartilage to free it up inferiorly. Some of the deviated ethmoid plate was then fractured and removed with Takahashi forceps to free up the posterior border of the quadrangular plate and allow it to swing back to the midline. The mucosal flaps were placed back into their anatomic position to allow visualization of the airways. The septum now sat in the midline with an improved airway.  A 3-0 Chromic suture on a Keith needle in used to anchor the inferior septum at the nasal spine with a through and through suture. The mucosal flaps are then sutured together using a through and through whip stitch of 4-0 Plain Gut with a mini-Keith needle. This was used to close the Clover incision as well.   The inferior turbinates were then inspected. An incision was created along the inferior aspect of the left inferior turbinate with removal of some of the inferior soft tissue and bone. Electrocautery was used to control bleeding in the area. The remaining turbinate was then outfractured to open up the airway further. There was no significant bleeding noted. The right turbinate was then trimmed and outfractured in a similar fashion.  The airways were then visualized and showed open passageways on both sides that were significantly improved compared to before surgery. There  was no signifcant bleeding. Nasal splints were applied to both sides of the septum using Xomed 0.67mm regular sized splints that were trimmed, and then held in position with a 3-0 Nylon through and through suture.  The patient was turned back over to anesthesia, and awakened,  extubated, and taken to the PACU in satisfactory condition.  Dispo:   PACU to home  Plan: Ice, elevation, narcotic analgesia, steroid taper, and prophylactic antibiotics for the duration of indwelling nasal foreign bodies.  We will reevaluate the patient in the office in 6 days and remove the septal splints.  Return to work in 10 days, strenuous activities in two weeks.   Beverly Sessions Keyle Doby 12/25/2020 9:42 AM

## 2020-12-25 NOTE — Anesthesia Postprocedure Evaluation (Signed)
Anesthesia Post Note  Patient: Shirley Phillips  Procedure(s) Performed: NASAL SEPTOPLASTY WITH INFERIOR TURBINATE REDUCTION (Bilateral: Nose)     Patient location during evaluation: PACU Anesthesia Type: General Level of consciousness: awake and alert and oriented Pain management: satisfactory to patient Vital Signs Assessment: post-procedure vital signs reviewed and stable Respiratory status: spontaneous breathing, nonlabored ventilation and respiratory function stable Cardiovascular status: blood pressure returned to baseline and stable Postop Assessment: Adequate PO intake and No signs of nausea or vomiting Anesthetic complications: no   No notable events documented.  Cherly Beach

## 2020-12-30 DIAGNOSIS — J3489 Other specified disorders of nose and nasal sinuses: Secondary | ICD-10-CM | POA: Diagnosis not present

## 2021-01-12 DIAGNOSIS — J3489 Other specified disorders of nose and nasal sinuses: Secondary | ICD-10-CM | POA: Diagnosis not present

## 2021-09-01 ENCOUNTER — Other Ambulatory Visit: Payer: Self-pay

## 2021-09-01 ENCOUNTER — Emergency Department
Admission: EM | Admit: 2021-09-01 | Discharge: 2021-09-01 | Disposition: A | Discharge status: Home / Self Care | Payer: Managed Care, Other (non HMO) | Attending: Emergency Medicine | Admitting: Emergency Medicine

## 2021-09-01 ENCOUNTER — Encounter: Payer: Self-pay | Admitting: Emergency Medicine

## 2021-09-01 ENCOUNTER — Emergency Department: Payer: Managed Care, Other (non HMO)

## 2021-09-01 DIAGNOSIS — R6884 Jaw pain: Secondary | ICD-10-CM | POA: Insufficient documentation

## 2021-09-01 DIAGNOSIS — R002 Palpitations: Secondary | ICD-10-CM | POA: Insufficient documentation

## 2021-09-01 DIAGNOSIS — R410 Disorientation, unspecified: Secondary | ICD-10-CM | POA: Diagnosis not present

## 2021-09-01 DIAGNOSIS — R0789 Other chest pain: Secondary | ICD-10-CM | POA: Diagnosis not present

## 2021-09-01 DIAGNOSIS — F419 Anxiety disorder, unspecified: Secondary | ICD-10-CM | POA: Diagnosis not present

## 2021-09-01 LAB — BASIC METABOLIC PANEL
Anion gap: 4 — ABNORMAL LOW (ref 5–15)
BUN: 11 mg/dL (ref 6–20)
CO2: 26 mmol/L (ref 22–32)
Calcium: 9 mg/dL (ref 8.9–10.3)
Chloride: 109 mmol/L (ref 98–111)
Creatinine, Ser: 0.64 mg/dL (ref 0.44–1.00)
GFR, Estimated: >60 mL/min (ref >60)
Glucose, Bld: 92 mg/dL (ref 70–99)
Potassium: 3.5 mmol/L (ref 3.5–5.1)
Sodium: 139 mmol/L (ref 135–145)

## 2021-09-01 LAB — POC URINE PREG, ED: Preg Test, Ur: NEGATIVE

## 2021-09-01 LAB — CBC
HCT: 34.1 % — ABNORMAL LOW (ref 36.0–46.0)
Hemoglobin: 11.4 g/dL — ABNORMAL LOW (ref 12.0–15.0)
MCH: 28.7 pg (ref 26.0–34.0)
MCHC: 33.4 g/dL (ref 30.0–36.0)
MCV: 85.9 fL (ref 80.0–100.0)
Platelets: 296 10*3/uL (ref 150–400)
RBC: 3.97 MIL/uL (ref 3.87–5.11)
RDW: 12 % (ref 11.5–15.5)
WBC: 5.2 10*3/uL (ref 4.0–10.5)
nRBC: 0 % (ref 0.0–0.2)

## 2021-09-01 LAB — TROPONIN I (HIGH SENSITIVITY)
Troponin I (High Sensitivity): <2 ng/L (ref <18)
Troponin I (High Sensitivity): <2 ng/L (ref <18)

## 2021-09-01 LAB — D-DIMER, QUANTITATIVE: D-Dimer, Quant: 0.34 ug/mL-FEU (ref 0.00–0.50)

## 2021-09-01 NOTE — ED Provider Notes (Signed)
[facility] Provider Note    Event Date/Time   First MD Initiated Contact with Patient 09/01/21 1520     (approximate)   History   Dizziness and Numbness   HPI  Shirley Phillips is a 36 y.o. female  with pmh hashimotos thyroiditis.  Pt was at church doing volunteer work when she started to have to have pain in her right jaw.  She then started feeling a right-sided headache.  She was in the car when then she started feeling numbness in both her left arm left leg and then right arm and right leg.  Started to feel what she describes as disoriented like she did not know where she was and was having slow reaction time.  Same time started to have a sharp pain in the center of her chest and feeling like she could not take a deep breath although denies frank shortness of breath.  Also felt palpitations.  She continues to have some ongoing chest tightness and feelings of disorientation but denies any ongoing palpitations or numbness.  Patient notes that she has had chest pain and palpitations in the past actually seen cardiology for it.  She had a negative stress test and echo that showed trivial mitral tricuspid and aortic regurg. The patient denies hx of prior DVT/PE, unilateral leg pain/swelling, hormone use, recent surgery, hx of cancer, prolonged immobilization, or hemoptysis.       Past Medical History:  Diagnosis Date   Anxiety    GERD (gastroesophageal reflux disease)     Patient Active Problem List   Diagnosis Date Noted   Hashimoto's thyroiditis 04/04/2020     Physical Exam  Triage Vital Signs: ED Triage Vitals  Enc Vitals Group     BP 09/01/21 1432 125/81     Pulse Rate 09/01/21 1432 92     Resp 09/01/21 1432 18     Temp 09/01/21 1432 98.5 F (36.9 C)     Temp src --      SpO2 09/01/21 1432 100 %     Weight 09/01/21 1433 123 lb (55.8 kg)     Height 09/01/21 1433 5\' 2"  (1.575 m)     Head Circumference --      Peak Flow --      Pain Score  09/01/21 1433 6     Pain Loc --      Pain Edu? --      Excl. in GC? --     Most recent vital signs: Vitals:   09/01/21 1630 09/01/21 1700  BP: 128/83 108/77  Pulse: 84 77  Resp: 11 12  Temp:    SpO2: 98% 100%     General: Awake, no distress.  Patient is anxious appearing CV:  Good peripheral perfusion.  Resp:  Normal effort.  Abd:  No distention.  Neuro:             Awake, Alert, Oriented x 3  Other:     ED Results / Procedures / Treatments  Labs (all labs ordered are listed, but only abnormal results are displayed) Labs Reviewed  BASIC METABOLIC PANEL - Abnormal; Notable for the following components:      Result Value   Anion gap 4 (*)    All other components within normal limits  CBC - Abnormal; Notable for the following components:   Hemoglobin 11.4 (*)    HCT 34.1 (*)    All other components within normal limits  D-DIMER, QUANTITATIVE  POC URINE PREG,  ED  TROPONIN I (HIGH SENSITIVITY)  TROPONIN I (HIGH SENSITIVITY)     EKG  EKG interpretation performed by myself: NSR, nml axis, nml intervals, ST depression in the inferior leads     RADIOLOGY I reviewed and interpreted the CXR which does not show any acute cardiopulmonary process    PROCEDURES:  Critical Care performed: No  Procedures  The patient is on the cardiac monitor to evaluate for evidence of arrhythmia and/or significant heart rate changes.   MEDICATIONS ORDERED IN ED: Medications - No data to display   IMPRESSION / MDM / ASSESSMENT AND PLAN / ED COURSE  I reviewed the triage vital signs and the nursing notes.                              Patient's presentation is most consistent with acute presentation with potential threat to life or bodily function.  Differential diagnosis includes, but is not limited to, panic attack, hypothyroidism, ACS, pulmonary embolism  Patient is a 36 year old female who presents with multiple symptoms including feelings of disorientation numbness  chest pain palpitations.  This all came on rather suddenly.  Denies any preceding or provoking factor.  Patient has seen cardiology in the past for palpitations and chest pain had a negative stress test.  She does have some ongoing chest tightness and feels like she is not all there but her numbness has resolved.  Patient does appear somewhat anxious on exam vitals are within normal limits her EKG has T wave inversions in the inferior leads unfortunately I am not able to see any prior EKGs.  She had an EKG done in care everywhere from 2022 that does comment on some abnormal ST segments in the lateral leads.  Overall my suspicion for ACS is low she has no risk factors and pain is atypical the event sounds more like anxiety to me.  Her first troponin is negative.  Given symptoms started less than 3 hours ago we will repeat the troponin.  Additionally with the T wave inversions in the inferior leads this could suggest right heart strain from PE.  We will send D-dimer to screen for this.  Repeat troponin is negative.  D-dimer is negative.  Patient is no longer having chest pain on reassessment.  She is tearful because she is missing her children's school event.  Think that this is most likely related to anxiety.  I did recommend she follow-up with cardiology and return to the ED if she has recurrent chest pain that is changing or does not improve.      FINAL CLINICAL IMPRESSION(S) / ED DIAGNOSES   Final diagnoses:  Palpitations     Rx / DC Orders   ED Discharge Orders     None        Note:  This document was prepared using Dragon voice recognition software and may include unintentional dictation errors.   Georga Hacking, MD 09/01/21 2007

## 2021-09-01 NOTE — ED Triage Notes (Signed)
Pt via EMS from home. Pt c/o fatigue, dizziness, disorientation, generalized chest tightness, and SOB that started today around noon. States that her chest gets tight when she tried to take a deep breath. Pt has a hx of Hashimoto's Disease. Pt is A&Ox4 and NAD

## 2021-09-01 NOTE — ED Triage Notes (Signed)
Arrives via [facility].  C/O dizziness, chest tightness, fuzziness onset today at 1245.  VS wnl.

## 2021-09-01 NOTE — ED Notes (Signed)
Pt placed on cardiac monitor 

## 2021-09-01 NOTE — Discharge Instructions (Signed)
Your cardiac work-up has been reassuring here.  Your troponins which are cardiac enzymes were negative and your screen for blood clots was also reassuring.  It is possible that this was related to using the energy drink or anxiety.  Please follow-up with your primary care provider if you continue to have symptoms.  If you develop worsening chest pain is not resolving please return to the emergency department.

## 2022-01-13 ENCOUNTER — Other Ambulatory Visit: Payer: Self-pay | Admitting: Obstetrics and Gynecology

## 2022-01-13 DIAGNOSIS — N809 Endometriosis, unspecified: Secondary | ICD-10-CM

## 2022-01-29 ENCOUNTER — Other Ambulatory Visit: Payer: Self-pay | Admitting: Obstetrics and Gynecology

## 2022-01-29 DIAGNOSIS — R102 Pelvic and perineal pain: Secondary | ICD-10-CM

## 2022-01-29 DIAGNOSIS — N809 Endometriosis, unspecified: Secondary | ICD-10-CM

## 2022-02-08 ENCOUNTER — Ambulatory Visit: Payer: Managed Care, Other (non HMO)

## 2022-02-14 ENCOUNTER — Ambulatory Visit
Admission: RE | Admit: 2022-02-14 | Discharge: 2022-02-14 | Disposition: A | Discharge status: Home / Self Care | Payer: Managed Care, Other (non HMO) | Source: Ambulatory Visit | Attending: Obstetrics and Gynecology | Admitting: Obstetrics and Gynecology

## 2022-02-14 DIAGNOSIS — R102 Pelvic and perineal pain: Secondary | ICD-10-CM | POA: Insufficient documentation

## 2022-02-14 DIAGNOSIS — N809 Endometriosis, unspecified: Secondary | ICD-10-CM | POA: Insufficient documentation

## 2022-02-14 MED ORDER — GADOBUTROL 1 MMOL/ML IV SOLN
5.0000 mL | Freq: Once | INTRAVENOUS | Status: AC | PRN
  Administered 2022-02-14: 7.5 mL via INTRAVENOUS

## 2022-04-24 IMAGING — MR MR HEAD W/O CM
10 series · 48 of 48 positions shown · non-contrast
Comparison: None.

CLINICAL DATA: Severe right-sided frontal headaches. Concern for
vertebral artery dissection.

EXAM:
MRI HEAD WITHOUT CONTRAST
TECHNIQUE: Multiplanar, multiecho pulse sequences of the brain and surrounding
structures were obtained without intravenous contrast.

[Series 5: DWI · axial · 3.0mm · 1.36mm/px · z∈[-97,+50]mm · 10 of 100 slices shown (1 of 2)]
[im 1/100]
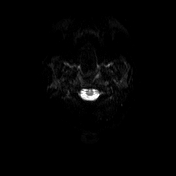
[im 12/100]
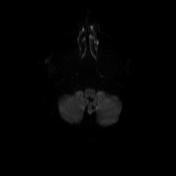
[im 23/100]
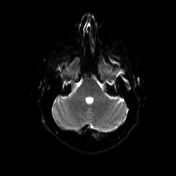
[im 34/100]
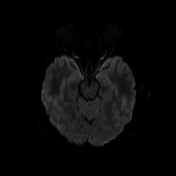
[im 45/100]
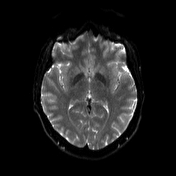
[im 56/100]
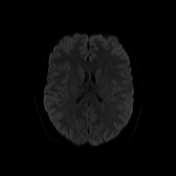
[im 67/100]
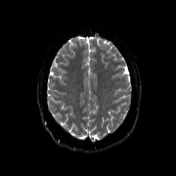
[im 78/100]
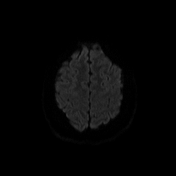
[im 89/100]
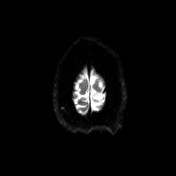
[im 100/100]
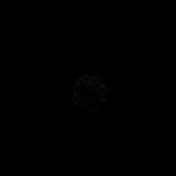

[Series 6: DWI · axial · 3.0mm · 1.36mm/px · z∈[-97,+50]mm · 4 of 50 slices shown (2 of 2)]
[im 1/50]
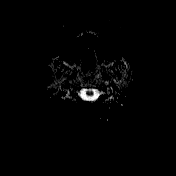
[im 17/50]
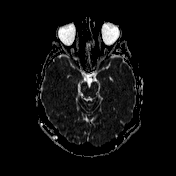
[im 33/50]
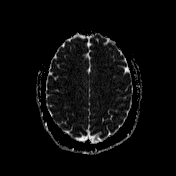
[im 50/50]
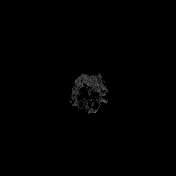

[Series 7: T1 · sagittal · 5.0mm · 0.75mm/px · 2 of 23 slices shown (1 of 2)]
[im 1/23]
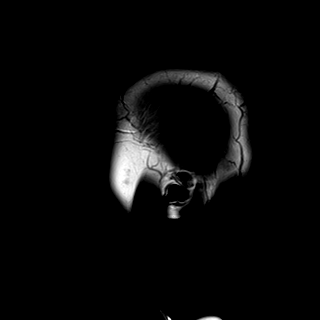
[im 23/23]
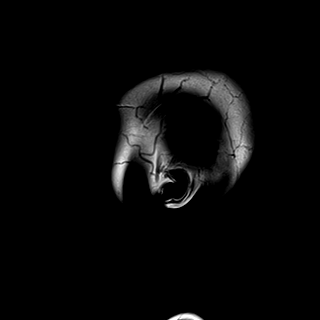

[Series 8: T2 · axial · 5.0mm · 0.62mm/px · z∈[-95,+48]mm · 2 of 23 slices shown (1 of 2)]
[im 1/23]
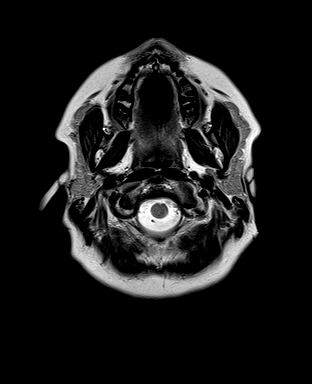
[im 23/23]
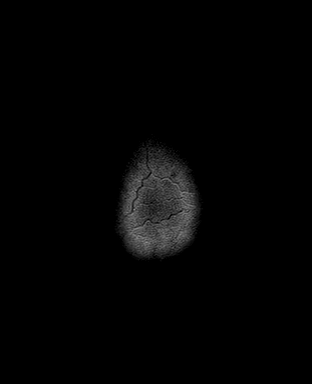

[Series 9: swi_images · axial · 3.0mm · 0.75mm/px · z∈[-100,+53]mm · 4 of 52 slices shown]
[im 1/52]
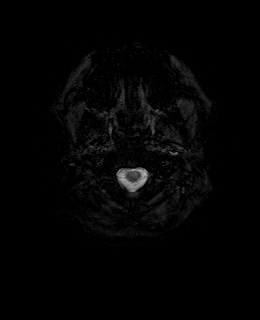
[im 18/52]
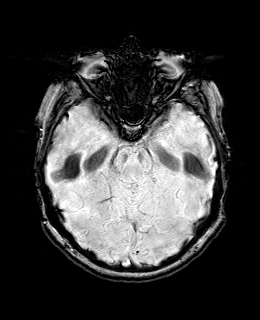
[im 35/52]
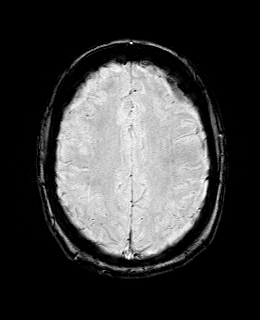
[im 52/52]
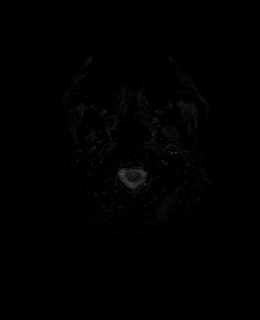

[Series 11: FLAIR · axial · 3.0mm · 0.75mm/px · z∈[-95,+49]mm · 4 of 49 slices shown]
[im 1/49]
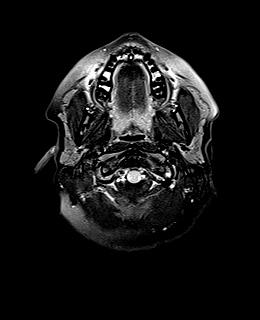
[im 17/49]
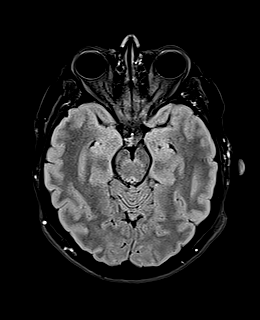
[im 33/49]
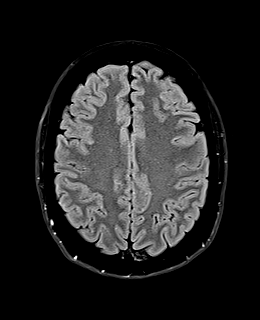
[im 49/49]
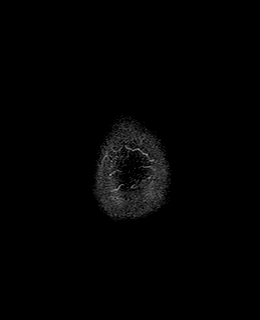

[Series 12: T1 · axial · 1.0mm · 0.94mm/px · z∈[-94,+49]mm · 12 of 144 slices shown (2 of 2)]
[im 1/144]
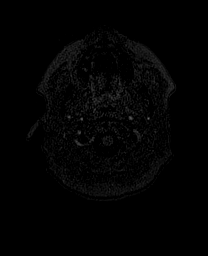
[im 14/144]
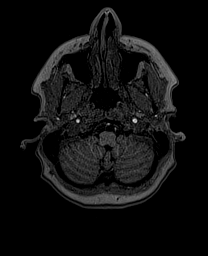
[im 27/144]
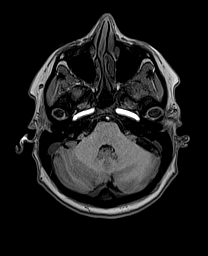
[im 40/144]
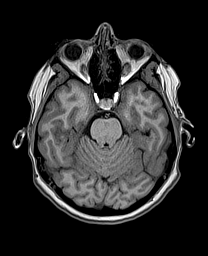
[im 53/144]
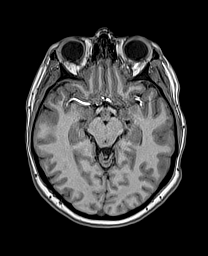
[im 66/144]
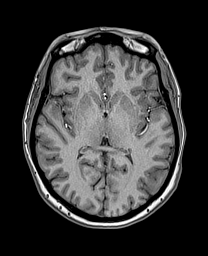
[im 79/144]
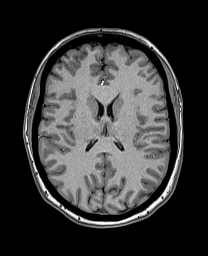
[im 92/144]
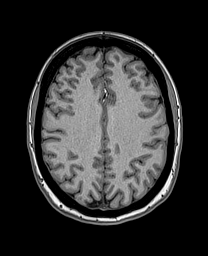
[im 105/144]
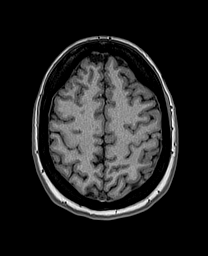
[im 118/144]
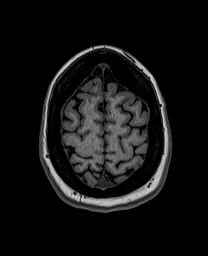
[im 131/144]
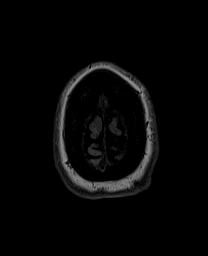
[im 144/144]
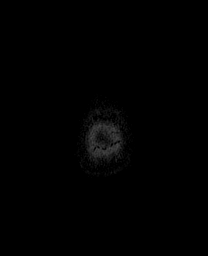

[Series 13: cor dwi_tracew · coronal · 5.0mm · 1.53mm/px · 5 of 54 slices shown]
[im 1/54]
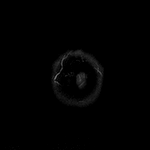
[im 14/54]
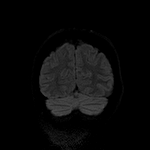
[im 27/54]
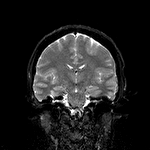
[im 40/54]
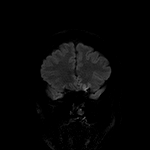
[im 54/54]
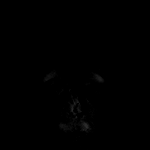

[Series 14: cor dwi_adc · coronal · 5.0mm · 1.53mm/px · 2 of 27 slices shown]
[im 1/27]
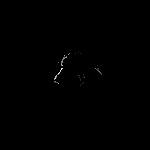
[im 27/27]
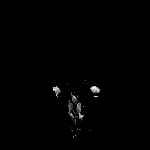

[Series 15: T2 · coronal · 5.0mm · 0.57mm/px · 3 of 35 slices shown (2 of 2)]
[im 1/35]
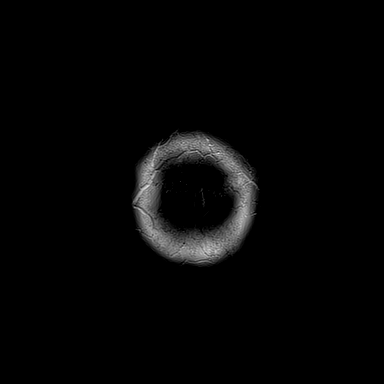
[im 18/35]
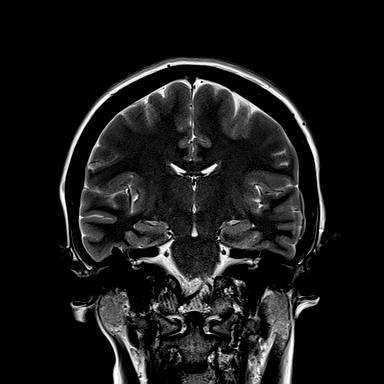
[im 35/35]
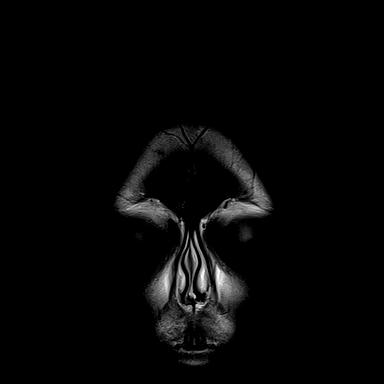

[48 of 48 positions shown; findings below may reference images not displayed]

FINDINGS: Brain: There is no evidence of an acute infarct, intracranial
hemorrhage, mass, midline shift, or extra-axial fluid collection.
The ventricles and sulci are normal. The cerebellar tonsils are
normally positioned. The brain is normal in signal.

Vascular: Major intracranial vascular flow voids are preserved.

Skull and upper cervical spine: Unremarkable bone marrow signal.

Sinuses/Orbits: Unremarkable orbits. Paranasal sinuses and mastoid
air cells are clear.

Other: None.
IMPRESSION: Negative brain MRI.

## 2023-12-07 ENCOUNTER — Other Ambulatory Visit: Payer: Self-pay | Admitting: Neurology

## 2023-12-07 DIAGNOSIS — G43109 Migraine with aura, not intractable, without status migrainosus: Secondary | ICD-10-CM

## 2023-12-07 DIAGNOSIS — R442 Other hallucinations: Secondary | ICD-10-CM

## 2023-12-07 DIAGNOSIS — R200 Anesthesia of skin: Secondary | ICD-10-CM

## 2023-12-13 ENCOUNTER — Encounter: Payer: Self-pay | Admitting: Radiology

## 2023-12-13 ENCOUNTER — Ambulatory Visit
Admission: RE | Admit: 2023-12-13 | Discharge: 2023-12-13 | Disposition: A | Discharge status: Home / Self Care | Source: Ambulatory Visit | Attending: Neurology | Admitting: Neurology

## 2023-12-13 DIAGNOSIS — R442 Other hallucinations: Secondary | ICD-10-CM | POA: Insufficient documentation

## 2023-12-13 DIAGNOSIS — R200 Anesthesia of skin: Secondary | ICD-10-CM | POA: Insufficient documentation

## 2023-12-13 DIAGNOSIS — R202 Paresthesia of skin: Secondary | ICD-10-CM | POA: Diagnosis present

## 2023-12-13 DIAGNOSIS — G43109 Migraine with aura, not intractable, without status migrainosus: Secondary | ICD-10-CM | POA: Insufficient documentation

## 2023-12-13 MED ORDER — GADOBUTROL 1 MMOL/ML IV SOLN
5.0000 mL | Freq: Once | INTRAVENOUS | Status: AC | PRN
  Administered 2023-12-13: 5 mL via INTRAVENOUS
